# Patient Record
Sex: Female | Born: 1995 | Race: White | Hispanic: No | Marital: Single | Smoking: Never smoker
Health system: Southern US, Community
[De-identification: ages and names within clinical notes are randomized; demographics above are authoritative.]

## PROBLEM LIST (undated history)

## (undated) DIAGNOSIS — F419 Anxiety disorder, unspecified: Secondary | ICD-10-CM

## (undated) DIAGNOSIS — R569 Unspecified convulsions: Secondary | ICD-10-CM

## (undated) HISTORY — DX: Anxiety disorder, unspecified: F41.9

## (undated) HISTORY — PX: WISDOM TOOTH EXTRACTION: SHX21

## (undated) HISTORY — PX: TONSILLECTOMY: SUR1361

## (undated) HISTORY — PX: EXPLORATORY LAPAROTOMY: SUR591

## (undated) HISTORY — DX: Unspecified convulsions: R56.9

---

## 2018-06-05 ENCOUNTER — Encounter (HOSPITAL_COMMUNITY): Payer: Self-pay | Admitting: Emergency Medicine

## 2018-06-05 ENCOUNTER — Ambulatory Visit (INDEPENDENT_AMBULATORY_CARE_PROVIDER_SITE_OTHER): Payer: BC Managed Care – PPO

## 2018-06-05 ENCOUNTER — Ambulatory Visit (HOSPITAL_COMMUNITY)
Admission: EM | Admit: 2018-06-05 | Discharge: 2018-06-05 | Disposition: A | Discharge status: Home / Self Care | Payer: BC Managed Care – PPO | Attending: Family Medicine | Admitting: Family Medicine

## 2018-06-05 DIAGNOSIS — S6992XA Unspecified injury of left wrist, hand and finger(s), initial encounter: Secondary | ICD-10-CM

## 2018-06-05 DIAGNOSIS — Y9368 Activity, volleyball (beach) (court): Secondary | ICD-10-CM

## 2018-06-05 DIAGNOSIS — M79645 Pain in left finger(s): Secondary | ICD-10-CM

## 2018-06-05 DIAGNOSIS — S63642A Sprain of metacarpophalangeal joint of left thumb, initial encounter: Secondary | ICD-10-CM

## 2018-06-05 MED ORDER — IBUPROFEN 800 MG PO TABS: 800.0000 mg | ORAL_TABLET | Freq: Three times a day (TID) | ORAL | 0 refills | Status: AC

## 2018-06-05 NOTE — ED Notes (Signed)
Patient refused Thumb Spica due to pain.  Applied ace wrap per Linus Mako PA.

## 2018-06-05 NOTE — ED Provider Notes (Signed)
[facility]    CSN: [id number] Arrival date & time: 06/05/18  1478     History   Chief Complaint Chief Complaint  Patient presents with  . Finger Injury    HPI Lisa Delgado is a 22 y.o. female.   Melah presents with complaints of left thumb pain after injury during a volleyball game last night. She "set" the ball and it landed wrong, she felt her thumb hyperextend. Has had a thumb sprain in the past. Ended up having to sit out the rest of the game due to pain. Pain 4/10 at rest, up to 7/10 with movement or touch. No numbness or tingling. Applied ice last night and took tylenol which minimally helped. No other hand injury. Without contributing medical history.      ROS per HPI.      History reviewed. No pertinent past medical history.  There are no active problems to display for this patient.   Past Surgical History:  Procedure Laterality Date  . EXPLORATORY LAPAROTOMY    . TONSILLECTOMY    . WISDOM TOOTH EXTRACTION      OB History   None      Home Medications    Prior to Admission medications   Medication Sig Start Date End Date Taking? Authorizing Provider  norethindrone-ethinyl estradiol-iron (ESTROSTEP FE,TILIA FE,TRI-LEGEST FE) 1-20/1-30/1-35 MG-MCG tablet Take 1 tablet by mouth daily.   Yes [provider]  ibuprofen (ADVIL,MOTRIN) 800 MG tablet Take 1 tablet (800 mg total) by mouth 3 (three) times daily. 06/05/18   Georgetta Haber, NP    Family History No family history on file.  Social History Social History   Tobacco Use  . Smoking status: Not on file  Substance Use Topics  . Alcohol use: Not on file  . Drug use: Not on file     Allergies   Phenergan [promethazine hcl]   Review of Systems Review of Systems   Physical Exam Triage Vital Signs ED Triage Vitals  Enc Vitals Group     BP 06/05/18 0857 115/74     Pulse Rate 06/05/18 0857 97     Resp 06/05/18 0857 18     Temp 06/05/18 0857 98.1 F (36.7  C)     Temp Source 06/05/18 0857 Oral     SpO2 06/05/18 0857 100 %     Weight 06/05/18 0902 150 lb (68 kg)     Height --      Head Circumference --      Peak Flow --      Pain Score 06/05/18 0901 4     Pain Loc --      Pain Edu? --      Excl. in GC? --    No data found.  Updated Vital Signs BP 115/74 (BP Location: Left Arm)   Pulse 97   Temp 98.1 F (36.7 C) (Oral)   Resp 18   Wt 150 lb (68 kg)   SpO2 100%    Physical Exam  Constitutional: She is oriented to person, place, and time. She appears well-developed and well-nourished. No distress.  Cardiovascular: Normal rate, regular rhythm and normal heart sounds.  Pulmonary/Chest: Effort normal and breath sounds normal.  Musculoskeletal:       Left wrist: Normal.       Left hand: She exhibits tenderness and bony tenderness. She exhibits normal range of motion, normal two-point discrimination, normal capillary refill, no deformity, no laceration and no swelling. Normal sensation noted. Decreased strength noted.  She exhibits thumb/finger opposition.  Pain with thumb opposition; pain with resistence in abduction; pain at mcp joint with palpation and flexion; cap refill < 2 seconds ; no pain to phalanxs of thumb or to dip joint; no redness or swelling; no snuff box tenderness   Neurological: She is alert and oriented to person, place, and time.  Skin: Skin is warm and dry.     UC Treatments / Results  Labs (all labs ordered are listed, but only abnormal results are displayed) Labs Reviewed - No data to display  EKG None  Radiology Dg Hand Complete Left  Result Date: 06/05/2018 CLINICAL DATA:  The patient suffered a left thumb injury playing volleyball last night. Initial encounter. EXAM: LEFT HAND - COMPLETE 3+ VIEW COMPARISON:  None. FINDINGS: There is no evidence of fracture or dislocation. There is no evidence of arthropathy or other focal bone abnormality. Soft tissues are unremarkable. IMPRESSION: Normal exam.  Electronically Signed   By: Drusilla Kanner M.D.   On: 06/05/2018 09:44    Procedures Procedures (including critical care time)  Medications Ordered in UC Medications - No data to display  Initial Impression / Assessment and Plan / UC Course  I have reviewed the triage vital signs and the nursing notes.  Pertinent labs & imaging results that were available during my care of the patient were reviewed by me and considered in my medical decision making (see chart for details).     Xray without acute bony findings. Consistent with strain. Thumb spica provided for as needed use for comfort, encouraged activity as tolerated, stretching and ROM. Ice, elevation, ibuprofen for pain control. Follow up with sports medicine. Patient verbalized understanding and agreeable to plan.   Final Clinical Impressions(s) / UC Diagnoses   Final diagnoses:  Sprain of metacarpophalangeal (MCP) joint of left thumb, initial encounter     Discharge Instructions     Use of brace provided for the next few days then wean out as able.  Ice, elevation, ibuprofen for pain control.  Please follow up with sports medicine for recheck in the next few weeks as needed.     ED Prescriptions    Medication Sig Dispense Auth. Provider   ibuprofen (ADVIL,MOTRIN) 800 MG tablet Take 1 tablet (800 mg total) by mouth 3 (three) times daily. 21 tablet Georgetta Haber, NP     Controlled Substance Prescriptions [place] Controlled Substance Registry consulted? Not Applicable   Georgetta Haber, NP 06/05/18 1001

## 2018-06-05 NOTE — Discharge Instructions (Signed)
Use of brace provided for the next few days then wean out as able.  Ice, elevation, ibuprofen for pain control.  Please follow up with sports medicine for recheck in the next few weeks as needed.

## 2018-06-05 NOTE — ED Triage Notes (Signed)
PT hyperextended right thumb last night while playing volley ball.

## 2018-06-26 ENCOUNTER — Other Ambulatory Visit: Payer: Self-pay

## 2018-06-26 ENCOUNTER — Encounter: Payer: Self-pay | Admitting: Emergency Medicine

## 2018-06-26 ENCOUNTER — Ambulatory Visit: Payer: BC Managed Care – PPO | Admitting: Emergency Medicine

## 2018-06-26 VITALS — BP 116/73 | HR 65 | Temp 99.0°F | Resp 16 | Ht 65.5 in | Wt 152.0 lb

## 2018-06-26 DIAGNOSIS — F411 Generalized anxiety disorder: Secondary | ICD-10-CM | POA: Insufficient documentation

## 2018-06-26 DIAGNOSIS — F418 Other specified anxiety disorders: Secondary | ICD-10-CM | POA: Insufficient documentation

## 2018-06-26 MED ORDER — HYDROXYZINE HCL 25 MG PO TABS: 25.0000 mg | ORAL_TABLET | Freq: Three times a day (TID) | ORAL | 0 refills | Status: DC | PRN

## 2018-06-26 MED ORDER — FLUOXETINE HCL 20 MG PO TABS: 20.0000 mg | ORAL_TABLET | Freq: Every day | ORAL | 3 refills | Status: DC

## 2018-06-26 MED ORDER — HYDROXYZINE HCL 25 MG PO TABS: 25.0000 mg | ORAL_TABLET | Freq: Four times a day (QID) | ORAL | 3 refills | Status: AC | PRN

## 2018-06-26 NOTE — Patient Instructions (Addendum)
   If you have lab work done today you will be contacted with your lab results within the next 2 weeks.  If you have not heard from us then please contact us. The fastest way to get your results is to register for My Chart.   IF you received an x-ray today, you will receive an invoice from [facility]. Please contact  Radiology at [phone] with questions or concerns regarding your invoice.   IF you received labwork today, you will receive an invoice from [facility]. Please contact [facility] at [phone] with questions or concerns regarding your invoice.   Our billing staff will not be able to assist you with questions regarding bills from these companies.  You will be contacted with the lab results as soon as they are available. The fastest way to get your results is to activate your My Chart account. Instructions are located on the last page of this paperwork. If you have not heard from us regarding the results in 2 weeks, please contact this office.      Living With Anxiety After being diagnosed with an anxiety disorder, you may be relieved to know why you have felt or behaved a certain way. It is natural to also feel overwhelmed about the treatment ahead and what it will mean for your life. With care and support, you can manage this condition and recover from it. How to cope with anxiety Dealing with stress Stress is your body's reaction to life changes and events, both good and bad. Stress can last just a few hours or it can be ongoing. Stress can play a major role in anxiety, so it is important to learn both how to cope with stress and how to think about it differently. Talk with your health care provider or a counselor to learn more about stress reduction. He or she may suggest some stress reduction techniques, such as:  Music therapy. This can include creating or listening to music that you enjoy and that inspires you.  Mindfulness-based meditation. This  involves being aware of your normal breaths, rather than trying to control your breathing. It can be done while sitting or walking.  Centering prayer. This is a kind of meditation that involves focusing on a word, phrase, or sacred image that is meaningful to you and that brings you peace.  Deep breathing. To do this, expand your stomach and inhale slowly through your nose. Hold your breath for 3-5 seconds. Then exhale slowly, allowing your stomach muscles to relax.  Self-talk. This is a skill where you identify thought patterns that lead to anxiety reactions and correct those thoughts.  Muscle relaxation. This involves tensing muscles then relaxing them.  Choose a stress reduction technique that fits your lifestyle and personality. Stress reduction techniques take time and practice. Set aside 5-15 minutes a day to do them. Therapists can offer training in these techniques. The training may be covered by some insurance plans. Other things you can do to manage stress include:  Keeping a stress diary. This can help you learn what triggers your stress and ways to control your response.  Thinking about how you respond to certain situations. You may not be able to control everything, but you can control your reaction.  Making time for activities that help you relax, and not feeling guilty about spending your time in this way.  Therapy combined with coping and stress-reduction skills provides the best chance for successful treatment. Medicines Medicines can help ease symptoms. Medicines for   anxiety include:  Anti-anxiety drugs.  Antidepressants.  Beta-blockers.  Medicines may be used as the main treatment for anxiety disorder, along with therapy, or if other treatments are not working. Medicines should be prescribed by a health care provider. Relationships Relationships can play a big part in helping you recover. Try to spend more time connecting with trusted friends and family members.  Consider going to couples counseling, taking family education classes, or going to family therapy. Therapy can help you and others better understand the condition. How to recognize changes in your condition Everyone has a different response to treatment for anxiety. Recovery from anxiety happens when symptoms decrease and stop interfering with your daily activities at home or work. This may mean that you will start to:  Have better concentration and focus.  Sleep better.  Be less irritable.  Have more energy.  Have improved memory.  It is important to recognize when your condition is getting worse. Contact your health care provider if your symptoms interfere with home or work and you do not feel like your condition is improving. Where to find help and support: You can get help and support from these sources:  Self-help groups.  Online and community organizations.  A trusted spiritual leader.  Couples counseling.  Family education classes.  Family therapy.  Follow these instructions at home:  Eat a healthy diet that includes plenty of vegetables, fruits, whole grains, low-fat dairy products, and lean protein. Do not eat a lot of foods that are high in solid fats, added sugars, or salt.  Exercise. Most adults should do the following: ? Exercise for at least 150 minutes each week. The exercise should increase your heart rate and make you sweat (moderate-intensity exercise). ? Strengthening exercises at least twice a week.  Cut down on caffeine, tobacco, alcohol, and other potentially harmful substances.  Get the right amount and quality of sleep. Most adults need 7-9 hours of sleep each night.  Make choices that simplify your life.  Take over-the-counter and prescription medicines only as told by your health care provider.  Avoid caffeine, alcohol, and certain over-the-counter cold medicines. These may make you feel worse. Ask your pharmacist which medicines to  avoid.  Keep all follow-up visits as told by your health care provider. This is important. Questions to ask your health care provider  Would I benefit from therapy?  How often should I follow up with a health care provider?  How long do I need to take medicine?  Are there any long-term side effects of my medicine?  Are there any alternatives to taking medicine? Contact a health care provider if:  You have a hard time staying focused or finishing daily tasks.  You spend many hours a day feeling worried about everyday life.  You become exhausted by worry.  You start to have headaches, feel tense, or have nausea.  You urinate more than normal.  You have diarrhea. Get help right away if:  You have a racing heart and shortness of breath.  You have thoughts of hurting yourself or others. If you ever feel like you may hurt yourself or others, or have thoughts about taking your own life, get help right away. You can go to your nearest emergency department or call:  Your local emergency services (911 in the [place]  A suicide crisis helpline, such as the National Suicide Prevention Lifeline at 1-800-273-8255. This is open 24-hours a day.  Summary  Taking steps to deal with stress can help calm   you.  Medicines cannot cure anxiety disorders, but they can help ease symptoms.  Family, friends, and partners can play a big part in helping you recover from an anxiety disorder. This information is not intended to replace advice given to you by your health care provider. Make sure you discuss any questions you have with your health care provider. Document Released: 07/24/2016 Document Revised: 07/24/2016 Document Reviewed: 07/24/2016 [employer] Interactive Patient Education  2018 [employer]  

## 2018-06-26 NOTE — Progress Notes (Signed)
Lisa Delgado 22 y.o.   Chief Complaint  Patient presents with  . Anxiety    per patient needs medication     HISTORY OF PRESENT ILLNESS: This is a 22 y.o. female complaining of general anxiety.  Patient is a Archivist.  Non-smoker.  Non-EtOH abuser.  Physical reactive, who recently sustained a fracture of her left thumb.  Volleyball player.  Good student who, she thinks, may be OCD and worries about everything.  In the past she has been on Prozac and hydroxyzine and both help with her generalized anxiety.  She does well in social groups or presentations.  Recently saw a mental health counselor on campus and advised to a physician.  Thinks she needs medication for this.  Has a family history of generalized anxiety, both parents and sister with similar problem.  Gets occasional headaches triggered by stress.  HPI   Prior to Admission medications   Medication Sig Start Date End Date Taking? Authorizing Provider  ibuprofen (ADVIL,MOTRIN) 800 MG tablet Take 1 tablet (800 mg total) by mouth 3 (three) times daily. 06/05/18  Yes Burky, Barron Alvine, NP  norethindrone-ethinyl estradiol-iron (ESTROSTEP FE,TILIA FE,TRI-LEGEST FE) 1-20/1-30/1-35 MG-MCG tablet Take 1 tablet by mouth daily.   Yes [provider]    Allergies  Allergen Reactions  . Phenergan [Promethazine Hcl] Other (See Comments)    Restless leg    There are no active problems to display for this patient.   No past medical history on file.  Past Surgical History:  Procedure Laterality Date  . EXPLORATORY LAPAROTOMY    . TONSILLECTOMY    . WISDOM TOOTH EXTRACTION      Social History   Socioeconomic History  . Marital status: Single    Spouse name: Not on file  . Number of children: Not on file  . Years of education: Not on file  . Highest education level: Not on file  Occupational History  . Not on file  Social Needs  . Financial resource strain: Not on file  . Food insecurity:    Worry: Not on  file    Inability: Not on file  . Transportation needs:    Medical: Not on file    Non-medical: Not on file  Tobacco Use  . Smoking status: Never Smoker  . Smokeless tobacco: Never Used  Substance and Sexual Activity  . Alcohol use: Never    Frequency: Never  . Drug use: Never  . Sexual activity: Not on file  Lifestyle  . Physical activity:    Days per week: Not on file    Minutes per session: Not on file  . Stress: Not on file  Relationships  . Social connections:    Talks on phone: Not on file    Gets together: Not on file    Attends religious service: Not on file    Active member of club or organization: Not on file    Attends meetings of clubs or organizations: Not on file    Relationship status: Not on file  . Intimate partner violence:    Fear of current or ex partner: Not on file    Emotionally abused: Not on file    Physically abused: Not on file    Forced sexual activity: Not on file  Other Topics Concern  . Not on file  Social History Narrative  . Not on file    No family history on file.   Review of Systems  Constitutional: Negative.  Negative for chills  and fever.  HENT: Negative.  Negative for hearing loss and sore throat.   Eyes: Negative.  Negative for blurred vision and double vision.  Respiratory: Negative.  Negative for cough and shortness of breath.   Cardiovascular: Negative.  Negative for chest pain and palpitations.  Gastrointestinal: Negative.  Negative for abdominal pain, diarrhea, nausea and vomiting.  Genitourinary: Negative.  Negative for dysuria and hematuria.  Musculoskeletal: Negative.  Negative for back pain, myalgias and neck pain.  Skin: Negative.  Negative for rash.  Neurological: Positive for headaches. Negative for dizziness.  Psychiatric/Behavioral: The patient is nervous/anxious.     Vitals:   06/26/18 1451  BP: 116/73  Pulse: 65  Resp: 16  Temp: 99 F (37.2 C)  SpO2: 98%    Physical Exam  Constitutional: She is  oriented to person, place, and time. She appears well-developed and well-nourished.  HENT:  Head: Normocephalic and atraumatic.  Nose: Nose normal.  Mouth/Throat: Oropharynx is clear and moist.  Eyes: Pupils are equal, round, and reactive to light. Conjunctivae and EOM are normal.  Neck: Normal range of motion. Neck supple.  Cardiovascular: Normal rate, regular rhythm and normal heart sounds.  Pulmonary/Chest: Effort normal and breath sounds normal.  Musculoskeletal: Normal range of motion.  Left hand.  Thumb spica splint on  Neurological: She is alert and oriented to person, place, and time. No sensory deficit. She exhibits normal muscle tone.  Skin: Skin is warm and dry. Capillary refill takes less than 2 seconds.  Psychiatric: She has a normal mood and affect. Her behavior is normal.  Vitals reviewed.    ASSESSMENT & PLAN: Adi was seen today for anxiety.  Diagnoses and all orders for this visit:  Generalized anxiety disorder -     FLUoxetine (PROZAC) 20 MG tablet; Take 1 tablet (20 mg total) by mouth daily. -     Ambulatory referral to Psychiatry  Situational anxiety -     Discontinue: hydrOXYzine (ATARAX/VISTARIL) 25 MG tablet; Take 1 tablet (25 mg total) by mouth 3 (three) times daily as needed. -     hydrOXYzine (ATARAX/VISTARIL) 25 MG tablet; Take 1 tablet (25 mg total) by mouth every 6 (six) hours as needed for anxiety.    Patient Instructions       If you have lab work done today you will be contacted with your lab results within the next 2 weeks.  If you have not heard from [place] then please contact us. The fastest way to get your results is to register for My Chart.   IF you received an x-ray today, you will receive an invoice from [facility]. Please contact [facility] at [phone] with questions or concerns regarding your invoice.   IF you received labwork today, you will receive an invoice from [place]. Please contact [facility] at  [phone] with questions or concerns regarding your invoice.   Our billing staff will not be able to assist you with questions regarding Delgado from these companies.  You will be contacted with the lab results as soon as they are available. The fastest way to get your results is to activate your My Chart account. Instructions are located on the last page of this paperwork. If you have not heard from [place] regarding the results in 2 weeks, please contact this office.     Living With Anxiety After being diagnosed with an anxiety disorder, you may be relieved to know why you have felt or behaved a certain way. It is natural to also feel  overwhelmed about the treatment ahead and what it will mean for your life. With care and support, you can manage this condition and recover from it. How to cope with anxiety Dealing with stress Stress is your body's reaction to life changes and events, both good and bad. Stress can last just a few hours or it can be ongoing. Stress can play a major role in anxiety, so it is important to learn both how to cope with stress and how to think about it differently. Talk with your health care provider or a counselor to learn more about stress reduction. He or she may suggest some stress reduction techniques, such as:  Music therapy. This can include creating or listening to music that you enjoy and that inspires you.  Mindfulness-based meditation. This involves being aware of your normal breaths, rather than trying to control your breathing. It can be done while sitting or walking.  Centering prayer. This is a kind of meditation that involves focusing on a word, phrase, or sacred image that is meaningful to you and that brings you peace.  Deep breathing. To do this, expand your stomach and inhale slowly through your nose. Hold your breath for 3-5 seconds. Then exhale slowly, allowing your stomach muscles to relax.  Self-talk. This is a skill where you identify thought  patterns that lead to anxiety reactions and correct those thoughts.  Muscle relaxation. This involves tensing muscles then relaxing them.  Choose a stress reduction technique that fits your lifestyle and personality. Stress reduction techniques take time and practice. Set aside 5-15 minutes a day to do them. Therapists can offer training in these techniques. The training may be covered by some insurance plans. Other things you can do to manage stress include:  Keeping a stress diary. This can help you learn what triggers your stress and ways to control your response.  Thinking about how you respond to certain situations. You may not be able to control everything, but you can control your reaction.  Making time for activities that help you relax, and not feeling guilty about spending your time in this way.  Therapy combined with coping and stress-reduction skills provides the best chance for successful treatment. Medicines Medicines can help ease symptoms. Medicines for anxiety include:  Anti-anxiety drugs.  Antidepressants.  Beta-blockers.  Medicines may be used as the main treatment for anxiety disorder, along with therapy, or if other treatments are not working. Medicines should be prescribed by a health care provider. Relationships Relationships can play a big part in helping you recover. Try to spend more time connecting with trusted friends and family members. Consider going to couples counseling, taking family education classes, or going to family therapy. Therapy can help you and others better understand the condition. How to recognize changes in your condition Everyone has a different response to treatment for anxiety. Recovery from anxiety happens when symptoms decrease and stop interfering with your daily activities at home or work. This may mean that you will start to:  Have better concentration and focus.  Sleep better.  Be less irritable.  Have more energy.  Have  improved memory.  It is important to recognize when your condition is getting worse. Contact your health care provider if your symptoms interfere with home or work and you do not feel like your condition is improving. Where to find help and support: You can get help and support from these sources:  Self-help groups.  Online and [employer] organizations.  A trusted spiritual leader.  Couples counseling.  Family education classes.  Family therapy.  Follow these instructions at home:  Eat a healthy diet that includes plenty of vegetables, fruits, whole grains, low-fat dairy products, and lean protein. Do not eat a lot of foods that are high in solid fats, added sugars, or salt.  Exercise. Most adults should do the following: ? Exercise for at least 150 minutes each week. The exercise should increase your heart rate and make you sweat (moderate-intensity exercise). ? Strengthening exercises at least twice a week.  Cut down on caffeine, tobacco, alcohol, and other potentially harmful substances.  Get the right amount and quality of sleep. Most adults need 7-9 hours of sleep each night.  Make choices that simplify your life.  Take over-the-counter and prescription medicines only as told by your health care provider.  Avoid caffeine, alcohol, and certain over-the-counter cold medicines. These may make you feel worse. Ask your pharmacist which medicines to avoid.  Keep all follow-up visits as told by your health care provider. This is important. Questions to ask your health care provider  Would I benefit from therapy?  How often should I follow up with a health care provider?  How long do I need to take medicine?  Are there any long-term side effects of my medicine?  Are there any alternatives to taking medicine? Contact a health care provider if:  You have a hard time staying focused or finishing daily tasks.  You spend many hours a day feeling worried about everyday  life.  You become exhausted by worry.  You start to have headaches, feel tense, or have nausea.  You urinate more than normal.  You have diarrhea. Get help right away if:  You have a racing heart and shortness of breath.  You have thoughts of hurting yourself or others. If you ever feel like you may hurt yourself or others, or have thoughts about taking your own life, get help right away. You can go to your nearest emergency department or call:  Your local emergency services (911 in the [place]  A suicide crisis helpline, such as the National Suicide Prevention Lifeline at 405-341-3323. This is open 24-hours a day.  Summary  Taking steps to deal with stress can help calm you.  Medicines cannot cure anxiety disorders, but they can help ease symptoms.  Family, friends, and partners can play a big part in helping you recover from an anxiety disorder. This information is not intended to replace advice given to you by your health care provider. Make sure you discuss any questions you have with your health care provider. Document Released: 07/24/2016 Document Revised: 07/24/2016 Document Reviewed: 07/24/2016 Elsevier Interactive Patient Education  2018 Elsevier Inc.      Edwina Barth, MD [facility]

## 2018-07-18 ENCOUNTER — Other Ambulatory Visit: Payer: Self-pay | Admitting: Emergency Medicine

## 2018-07-18 DIAGNOSIS — F411 Generalized anxiety disorder: Secondary | ICD-10-CM

## 2018-07-18 NOTE — Telephone Encounter (Signed)
Requested medication (s) are due for refill today: No  Requested medication (s) are on the active medication list: yes  Last refill:  06/26/18   #30  3 refills  Future visit scheduled: no  Notes to clinic:  Pharmacy requesting # 90 with 2 refills and DX code    Requested Prescriptions  Pending Prescriptions Disp Refills   Fluoxetine HCl, PMDD, 20 MG TABS [Pharmacy Med Name: FLUOXETINE HCL 20 MG TABLET] 90 tablet 2    Sig: TAKE 1 TABLET BY MOUTH EVERY DAY     Psychiatry:  Antidepressants - SSRI Passed - 07/18/2018  8:31 AM      Passed - Valid encounter within last 6 months    Recent Outpatient Visits          3 weeks ago Generalized anxiety disorder   [facility], Eilleen KempfMiguel Jose, MD

## 2018-07-21 NOTE — Telephone Encounter (Signed)
Patient is requesting a refill of the following medications: Requested Prescriptions   Pending Prescriptions Disp Refills  . Fluoxetine HCl, PMDD, 20 MG TABS [Pharmacy Med Name: FLUOXETINE HCL 20 MG TABLET] 90 tablet 2    Sig: TAKE 1 TABLET BY MOUTH EVERY DAY

## 2018-07-30 ENCOUNTER — Encounter: Payer: Self-pay | Admitting: Emergency Medicine

## 2018-07-31 ENCOUNTER — Other Ambulatory Visit: Payer: Self-pay | Admitting: Emergency Medicine

## 2018-07-31 MED ORDER — FLUOXETINE HCL 10 MG PO CAPS: 10.0000 mg | ORAL_CAPSULE | Freq: Every day | ORAL | 1 refills | Status: AC

## 2018-07-31 NOTE — Telephone Encounter (Signed)
20 mg is not a high dose so she could come off medication right away however due to possible withdrawal symptoms I recommend taking 10 mg for 1 to 2 weeks before completely coming off Prozac.

## 2019-07-31 ENCOUNTER — Other Ambulatory Visit: Payer: Self-pay

## 2019-07-31 DIAGNOSIS — Z20822 Contact with and (suspected) exposure to covid-19: Secondary | ICD-10-CM

## 2019-08-01 LAB — NOVEL CORONAVIRUS, NAA: SARS-CoV-2, NAA: NOT DETECTED

## 2020-01-22 ENCOUNTER — Encounter: Payer: Self-pay | Admitting: Registered Nurse

## 2020-01-22 ENCOUNTER — Other Ambulatory Visit: Payer: Self-pay

## 2020-01-22 ENCOUNTER — Ambulatory Visit (INDEPENDENT_AMBULATORY_CARE_PROVIDER_SITE_OTHER): Payer: 59 | Admitting: Registered Nurse

## 2020-01-22 VITALS — BP 118/78 | HR 67 | Temp 98.3°F | Resp 17 | Ht 65.5 in | Wt 152.6 lb

## 2020-01-22 DIAGNOSIS — L0591 Pilonidal cyst without abscess: Secondary | ICD-10-CM

## 2020-01-22 MED ORDER — SULFAMETHOXAZOLE-TRIMETHOPRIM 800-160 MG PO TABS: 1.0000 | ORAL_TABLET | Freq: Two times a day (BID) | ORAL | 0 refills | Status: AC

## 2020-01-22 NOTE — Progress Notes (Signed)
Acute Office Visit  Subjective:    Patient ID: Lisa Delgado, female    DOB: 02-12-1996, 24 y.o.   MRN: [id number]  Chief Complaint  Patient presents with  . Cyst    patient states she has a cyst right on her tailbone that she has had for years but when she was younger it burst and was bleeding and but now is back and painful.     HPI Patient is in today for cyst  Located lower back - right of midline, top of intragluteal cleft.  Painful, tender, warm. Has been inflamed in past. At age 7 pt reports it burst, bled, has purulent drainage. No tx at that time. Feels inflamed 1-2 times per year, but usually not this bad Denies systemic symptoms including fever chills, fatigue shob, doe, sweats, nvd.  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  Past Medical History:  Diagnosis Date  . Anxiety   . Seizures ([place])     Past Surgical History:  Procedure Laterality Date  . EXPLORATORY LAPAROTOMY    . TONSILLECTOMY    . WISDOM TOOTH EXTRACTION      Family History  Problem Relation Age of Onset  . Cancer Maternal Grandmother   . Cancer Maternal Grandfather   . Cancer Paternal Grandmother   . Diabetes Paternal Grandmother   . Cancer Paternal Grandfather     Social History   Socioeconomic History  . Marital status: Single    Spouse name: Not on file  . Number of children: Not on file  . Years of education: Not on file  . Highest education level: Not on file  Occupational History  . Not on file  Tobacco Use  . Smoking status: Never Smoker  . Smokeless tobacco: Never Used  Substance and Sexual Activity  . Alcohol use: Never  . Drug use: Never  . Sexual activity: Not on file  Other Topics Concern  . Not on file  Social History Narrative  . Not on file   Social Determinants of Health   Financial Resource Strain:   . Difficulty of Paying Living Expenses:   Food Insecurity:   .  Worried About Charity fundraiser in the Last Year:   . Arboriculturist in the Last Year:   Transportation Needs:   . Film/video editor (Medical):   Marland Kitchen Lack of Transportation (Non-Medical):   Physical Activity:   . Days of Exercise per Week:   . Minutes of Exercise per Session:   Stress:   . Feeling of Stress :   Social Connections:   . Frequency of Communication with Friends and Family:   . Frequency of Social Gatherings with Friends and Family:   . Attends Religious Services:   . Active Member of Clubs or Organizations:   . Attends Archivist Meetings:   Marland Kitchen Marital Status:   Intimate Partner Violence:   . Fear of Current or Ex-Partner:   . Emotionally Abused:   Marland Kitchen Physically Abused:   . Sexually Abused:     Outpatient Medications Prior to Visit  Medication Sig Dispense Refill  . Fluoxetine HCl, PMDD, 20 MG TABS TAKE 1 TABLET BY MOUTH EVERY DAY 90 tablet 2  . hydrOXYzine (ATARAX/VISTARIL) 25 MG tablet Take 1 tablet (25 mg total) by mouth every 6 (six) hours as needed for anxiety. 30 tablet 3  . FLUoxetine (PROZAC) 10 MG capsule  Take 1 capsule (10 mg total) by mouth daily for 10 days. 10 capsule 1  . ibuprofen (ADVIL,MOTRIN) 800 MG tablet Take 1 tablet (800 mg total) by mouth 3 (three) times daily. (Patient not taking: Reported on 01/22/2020) 21 tablet 0  . norethindrone-ethinyl estradiol-iron (ESTROSTEP FE,TILIA FE,TRI-LEGEST FE) 1-20/1-30/1-35 MG-MCG tablet Take 1 tablet by mouth daily. (Patient not taking: Reported on 01/22/2020)     No facility-administered medications prior to visit.    Allergies  Allergen Reactions  . Phenergan [Promethazine Hcl] Other (See Comments)    Restless leg    Review of Systems Per hpi      Objective:    Physical Exam Vitals and nursing note reviewed.  Constitutional:      General: She is not in acute distress.    Appearance: Normal appearance. She is normal weight. She is not ill-appearing, toxic-appearing or diaphoretic.    Cardiovascular:     Rate and Rhythm: Normal rate and regular rhythm.  Skin:    General: Skin is warm and dry.     Capillary Refill: Capillary refill takes less than 2 seconds.     Coloration: Skin is not jaundiced or pale.     Findings: Lesion (red, inflammed, warm lesion on R side of tail bone, ttp. no discrete abscess or cyst, no drainable area of note. ) present. No bruising, erythema or rash.  Neurological:     General: No focal deficit present.     Mental Status: She is alert and oriented to person, place, and time. Mental status is at baseline.  Psychiatric:        Mood and Affect: Mood normal.        Behavior: Behavior normal.        Thought Content: Thought content normal.        Judgment: Judgment normal.     BP 118/78   Pulse 67   Temp 98.3 F (36.8 C) (Temporal)   Resp 17   Ht 5' 5.5" (1.664 m)   Wt 152 lb 9.6 oz (69.2 kg)   SpO2 96%   BMI 25.01 kg/m  Wt Readings from Last 3 Encounters:  01/22/20 152 lb 9.6 oz (69.2 kg)  06/26/18 152 lb (68.9 kg)  06/05/18 150 lb (68 kg)    Health Maintenance Due  Topic Date Due  . Hepatitis C Screening  Never done  . HIV Screening  Never done    There are no preventive care reminders to display for this patient.   No results found for: TSH No results found for: WBC, HGB, HCT, MCV, PLT No results found for: NA, K, CHLORIDE, CO2, GLUCOSE, BUN, CREATININE, BILITOT, ALKPHOS, AST, ALT, PROT, ALBUMIN, CALCIUM, ANIONGAP, EGFR, GFR No results found for: CHOL No results found for: HDL No results found for: LDLCALC No results found for: TRIG No results found for: CHOLHDL No results found for: HGBA1C     Assessment & Plan:   Problem List Items Addressed This Visit    None    Visit Diagnoses    Pilonidal cyst    -  Primary   Relevant Medications   sulfamethoxazole-trimethoprim (BACTRIM DS) 800-160 MG tablet       Meds ordered this encounter  Medications  . sulfamethoxazole-trimethoprim (BACTRIM DS) 800-160 MG  tablet    Sig: Take 1 tablet by mouth 2 (two) times daily.    Dispense:  14 tablet    Refill:  0    Order Specific Question:   Supervising Provider  Answer:   Carlota Raspberry, JEFFREY R [[id number]]   PLAN  Pilonidal cyst without discernable discrete lesion that would be able to be drained.  Bactrim DS PO bid for 7 days  Return if symptoms worsen or fail to improve, or if systemic symptoms arise.  Consider Gen surg referral if discrete lesion arises  Patient encouraged to call clinic with any questions, comments, or concerns.  Maximiano Coss, NP

## 2020-01-22 NOTE — Patient Instructions (Signed)
° ° ° °  If you have lab work done today you will be contacted with your lab results within the next 2 weeks.  If you have not heard from us then please contact us. The fastest way to get your results is to register for My Chart. ° ° °IF you received an x-ray today, you will receive an invoice from [facility]. Please contact [facility] at [phone] with questions or concerns regarding your invoice.  ° °IF you received labwork today, you will receive an invoice from [facility]. Please contact [facility] at [phone] with questions or concerns regarding your invoice.  ° °Our billing staff will not be able to assist you with questions regarding bills from these companies. ° °You will be contacted with the lab results as soon as they are available. The fastest way to get your results is to activate your My Chart account. Instructions are located on the last page of this paperwork. If you have not heard from us regarding the results in 2 weeks, please contact this office. °  ° ° ° °

## 2020-02-24 IMAGING — DX DG HAND COMPLETE 3+V*L*
3 series · 3 of 3 positions shown · non-contrast
Comparison: None.

CLINICAL DATA: The patient suffered a left thumb injury playing
volleyball last night. Initial encounter.

EXAM:
LEFT HAND - COMPLETE 3+ VIEW

[hand pa]
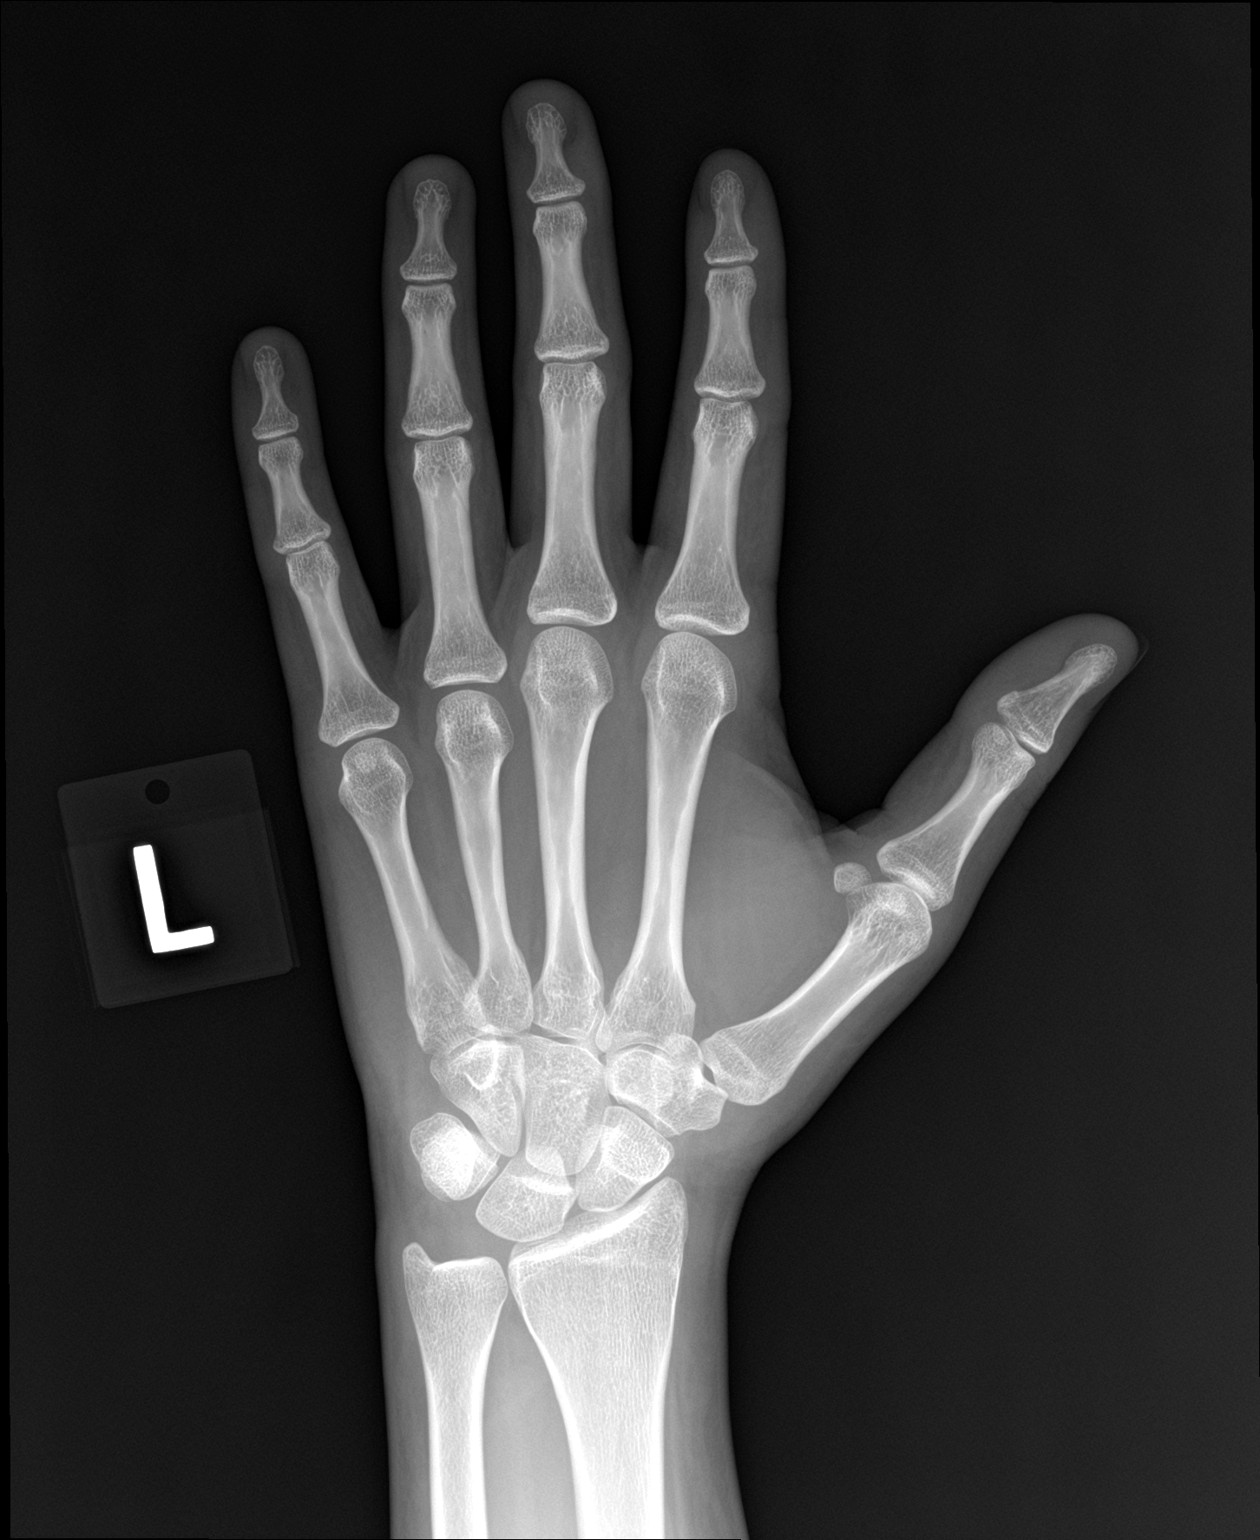

[hand obl]
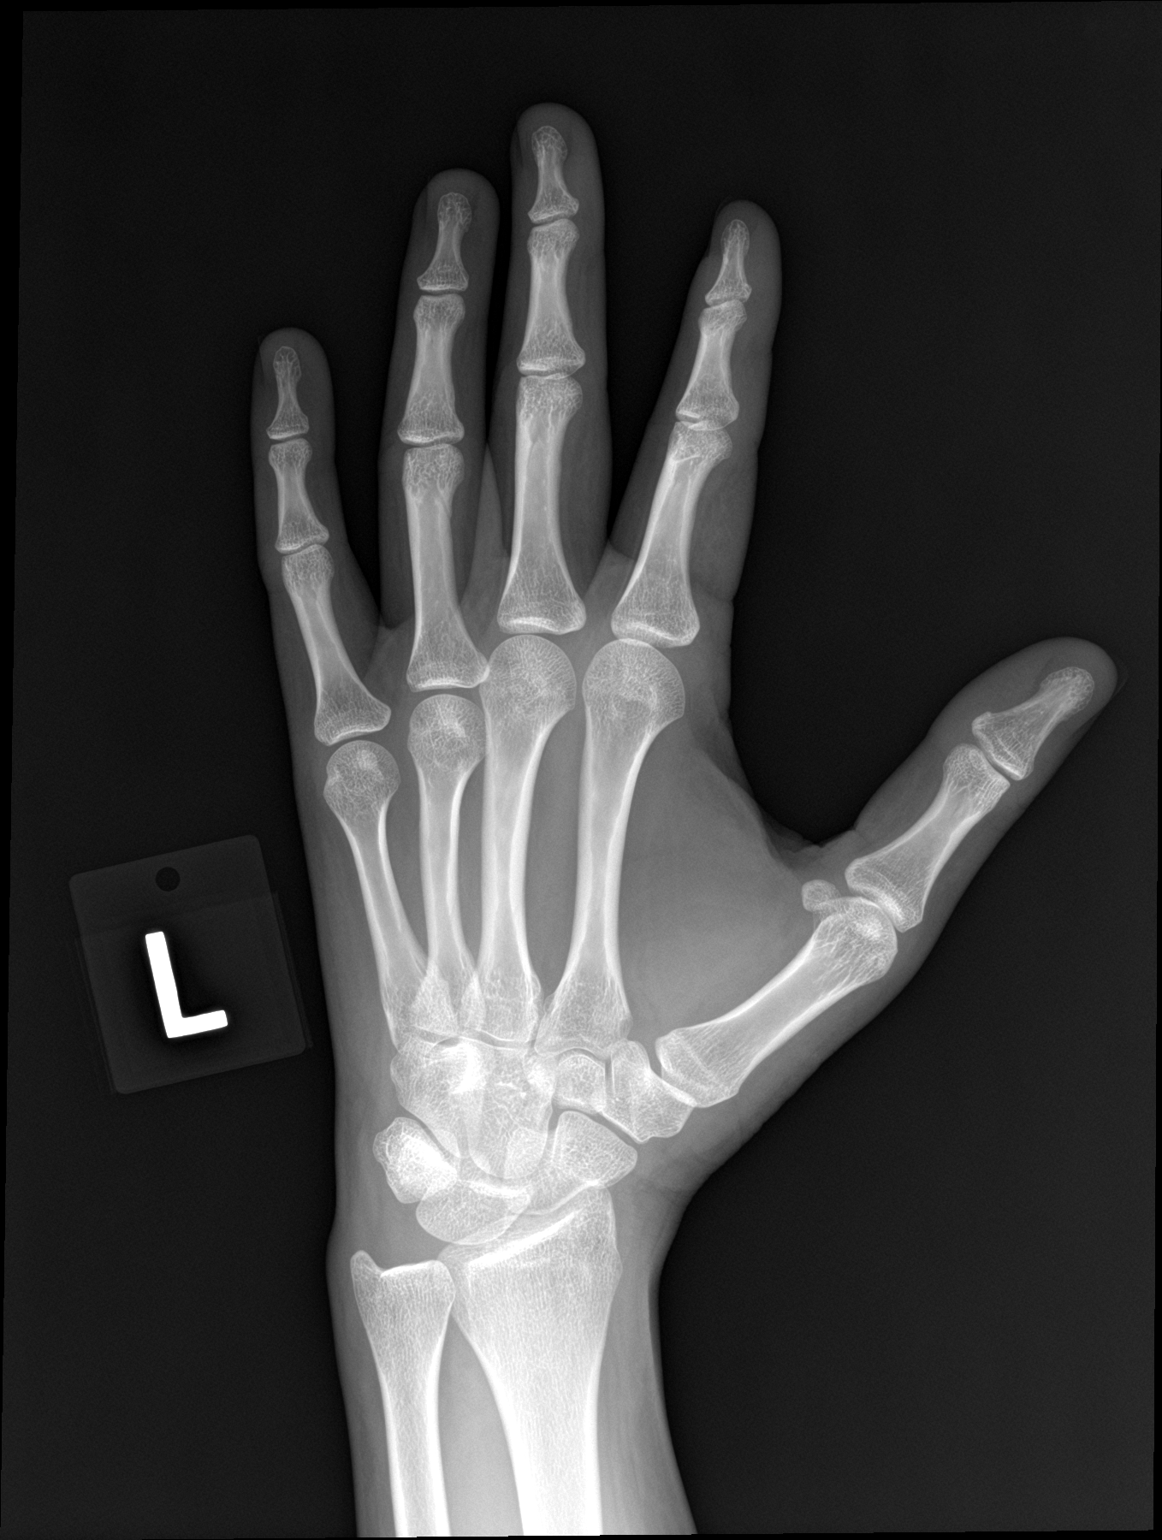

[hand lat]
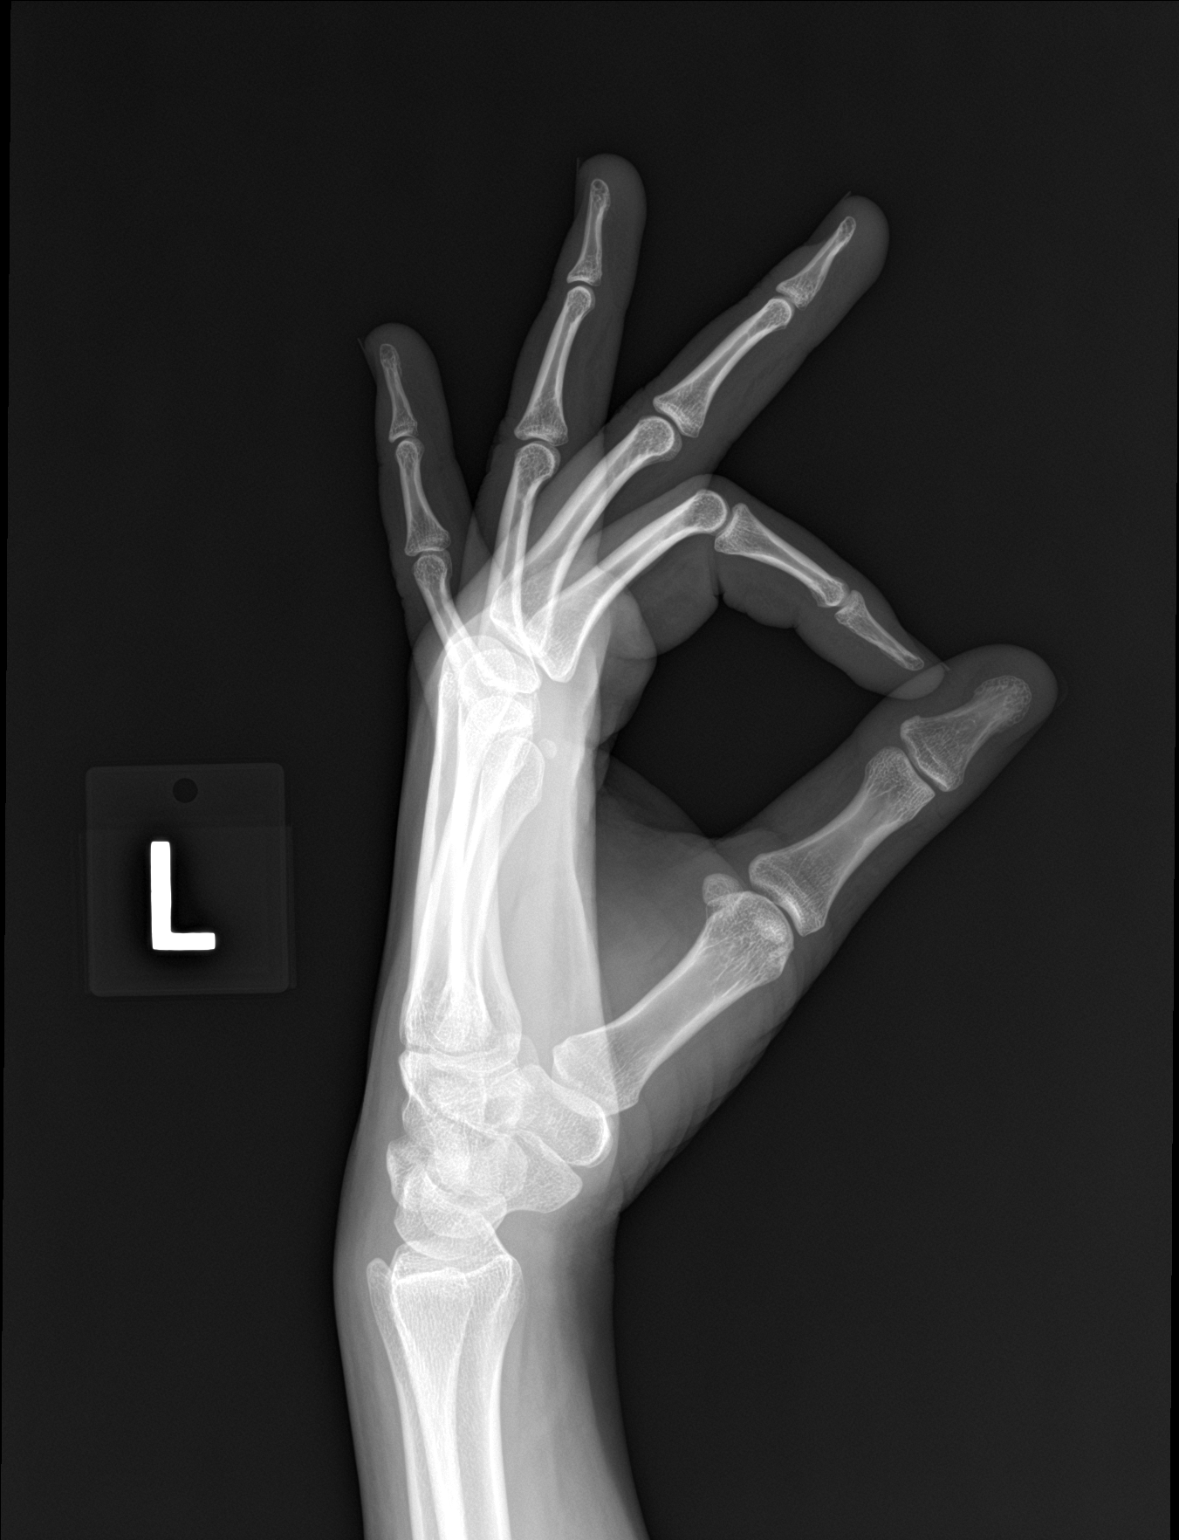

[3 of 3 positions shown; findings below may reference images not displayed]

FINDINGS: There is no evidence of fracture or dislocation. There is no
evidence of arthropathy or other focal bone abnormality. Soft
tissues are unremarkable.
IMPRESSION: Normal exam.
